# Patient Record
Sex: Male | Born: 1995 | Race: White | Hispanic: No | Marital: Single | Smoking: Never smoker
Health system: Southern US, Community
[De-identification: ages and names within clinical notes are randomized; demographics above are authoritative.]

## PROBLEM LIST (undated history)

## (undated) HISTORY — PX: FRACTURE SURGERY: SHX138

---

## 2014-07-08 ENCOUNTER — Emergency Department (HOSPITAL_COMMUNITY)
Admission: EM | Admit: 2014-07-08 | Discharge: 2014-07-08 | Disposition: A | Discharge status: Home / Self Care | Payer: BLUE CROSS/BLUE SHIELD | Attending: Emergency Medicine | Admitting: Emergency Medicine

## 2014-07-08 ENCOUNTER — Emergency Department (HOSPITAL_COMMUNITY): Payer: BLUE CROSS/BLUE SHIELD

## 2014-07-08 ENCOUNTER — Encounter (HOSPITAL_COMMUNITY): Payer: Self-pay

## 2014-07-08 DIAGNOSIS — Z880 Allergy status to penicillin: Secondary | ICD-10-CM | POA: Insufficient documentation

## 2014-07-08 DIAGNOSIS — Y92410 Unspecified street and highway as the place of occurrence of the external cause: Secondary | ICD-10-CM | POA: Diagnosis not present

## 2014-07-08 DIAGNOSIS — Y9389 Activity, other specified: Secondary | ICD-10-CM | POA: Diagnosis not present

## 2014-07-08 DIAGNOSIS — S40212A Abrasion of left shoulder, initial encounter: Secondary | ICD-10-CM | POA: Diagnosis not present

## 2014-07-08 DIAGNOSIS — S161XXA Strain of muscle, fascia and tendon at neck level, initial encounter: Secondary | ICD-10-CM | POA: Diagnosis not present

## 2014-07-08 DIAGNOSIS — Y998 Other external cause status: Secondary | ICD-10-CM | POA: Diagnosis not present

## 2014-07-08 DIAGNOSIS — S6992XA Unspecified injury of left wrist, hand and finger(s), initial encounter: Secondary | ICD-10-CM | POA: Diagnosis present

## 2014-07-08 DIAGNOSIS — S60512A Abrasion of left hand, initial encounter: Secondary | ICD-10-CM

## 2014-07-08 MED ORDER — CYCLOBENZAPRINE HCL 5 MG PO TABS: 5.0000 mg | ORAL_TABLET | Freq: Two times a day (BID) | ORAL | Status: AC | PRN

## 2014-07-08 NOTE — ED Notes (Signed)
Pt placed on cardiac monitor and pulse ox;RN at bedside
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
## 2014-07-08 NOTE — Discharge Instructions (Signed)
Cervical Sprain A cervical sprain is when the tissues (ligaments) that hold the neck bones in place stretch or tear. HOME CARE   Put ice on the injured area.  Put ice in a plastic bag.  Place a towel between your skin and the bag.  Leave the ice on for 15-20 minutes, 3-4 times a day.  You may have been given a collar to wear. This collar keeps your neck from moving while you heal.  Do not take the collar off unless told by your doctor.  If you have long hair, keep it outside of the collar.  Ask your doctor before changing the position of your collar. You may need to change its position over time to make it more comfortable.  If you are allowed to take off the collar for cleaning or bathing, follow your doctor's instructions on how to do it safely.  Keep your collar clean by wiping it with mild soap and water. Dry it completely. If the collar has removable pads, remove them every 1-2 days to hand wash them with soap and water. Allow them to air dry. They should be dry before you wear them in the collar.  Do not drive while wearing the collar.  Only take medicine as told by your doctor.  Keep all doctor visits as told.  Keep all physical therapy visits as told.  Adjust your work station so that you have good posture while you work.  Avoid positions and activities that make your problems worse.  Warm up and stretch before being active. GET HELP IF:  Your pain is not controlled with medicine.  You cannot take less pain medicine over time as planned.  Your activity level does not improve as expected. GET HELP RIGHT AWAY IF:   You are bleeding.  Your stomach is upset.  You have an allergic reaction to your medicine.  You develop new problems that you cannot explain.  You lose feeling (become numb) or you cannot move any part of your body (paralysis).  You have tingling or weakness in any part of your body.  Your symptoms get worse. Symptoms include:  Pain,  soreness, stiffness, puffiness (swelling), or a burning feeling in your neck.  Pain when your neck is touched.  Shoulder or upper back pain.  Limited ability to move your neck.  Headache.  Dizziness.  Your hands or arms feel week, lose feeling, or tingle.  Muscle spasms.  Difficulty swallowing or chewing. MAKE SURE YOU:   Understand these instructions.  Will watch your condition.  Will get help right away if you are not doing well or get worse. Document Released: 10/02/2007 Document Revised: 12/16/2012 Document Reviewed: 10/21/2012 [employer] Patient Information 2015 [employer]. This information is not intended to replace advice given to you by your health care provider. Make sure you discuss any questions you have with your health care provider.  
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
